# Patient Record
Sex: Male | Born: 1945 | Hispanic: Yes | Marital: Married
Health system: Southern US, Community
[De-identification: ages and names within clinical notes are randomized; demographics above are authoritative.]

---

## 2006-06-26 ENCOUNTER — Other Ambulatory Visit: Payer: Self-pay

## 2006-06-30 ENCOUNTER — Ambulatory Visit: Payer: Self-pay | Admitting: Surgery

## 2008-01-06 ENCOUNTER — Other Ambulatory Visit: Payer: Self-pay

## 2008-01-06 ENCOUNTER — Ambulatory Visit: Payer: Self-pay | Admitting: Surgery

## 2008-07-13 IMAGING — CR DG CHEST 2V
1 series · 2 of 2 positions shown · non-contrast
Comparison: none

REASON FOR EXAM: htn
COMMENTS:

PROCEDURE:     DXR - DXR CHEST PA (OR AP) AND LATERAL  - June 26, 2006  [DATE]
RESULT:     PA and lateral view reveals the cardiomediastinal structures to
be within normal limits.  The lung fields are clear. Vascularity is within
normal limits with no effusions seen.

[Series 1: view not recorded · 0.17mm/px · 2 of 2 slices shown]
[im 1/2]
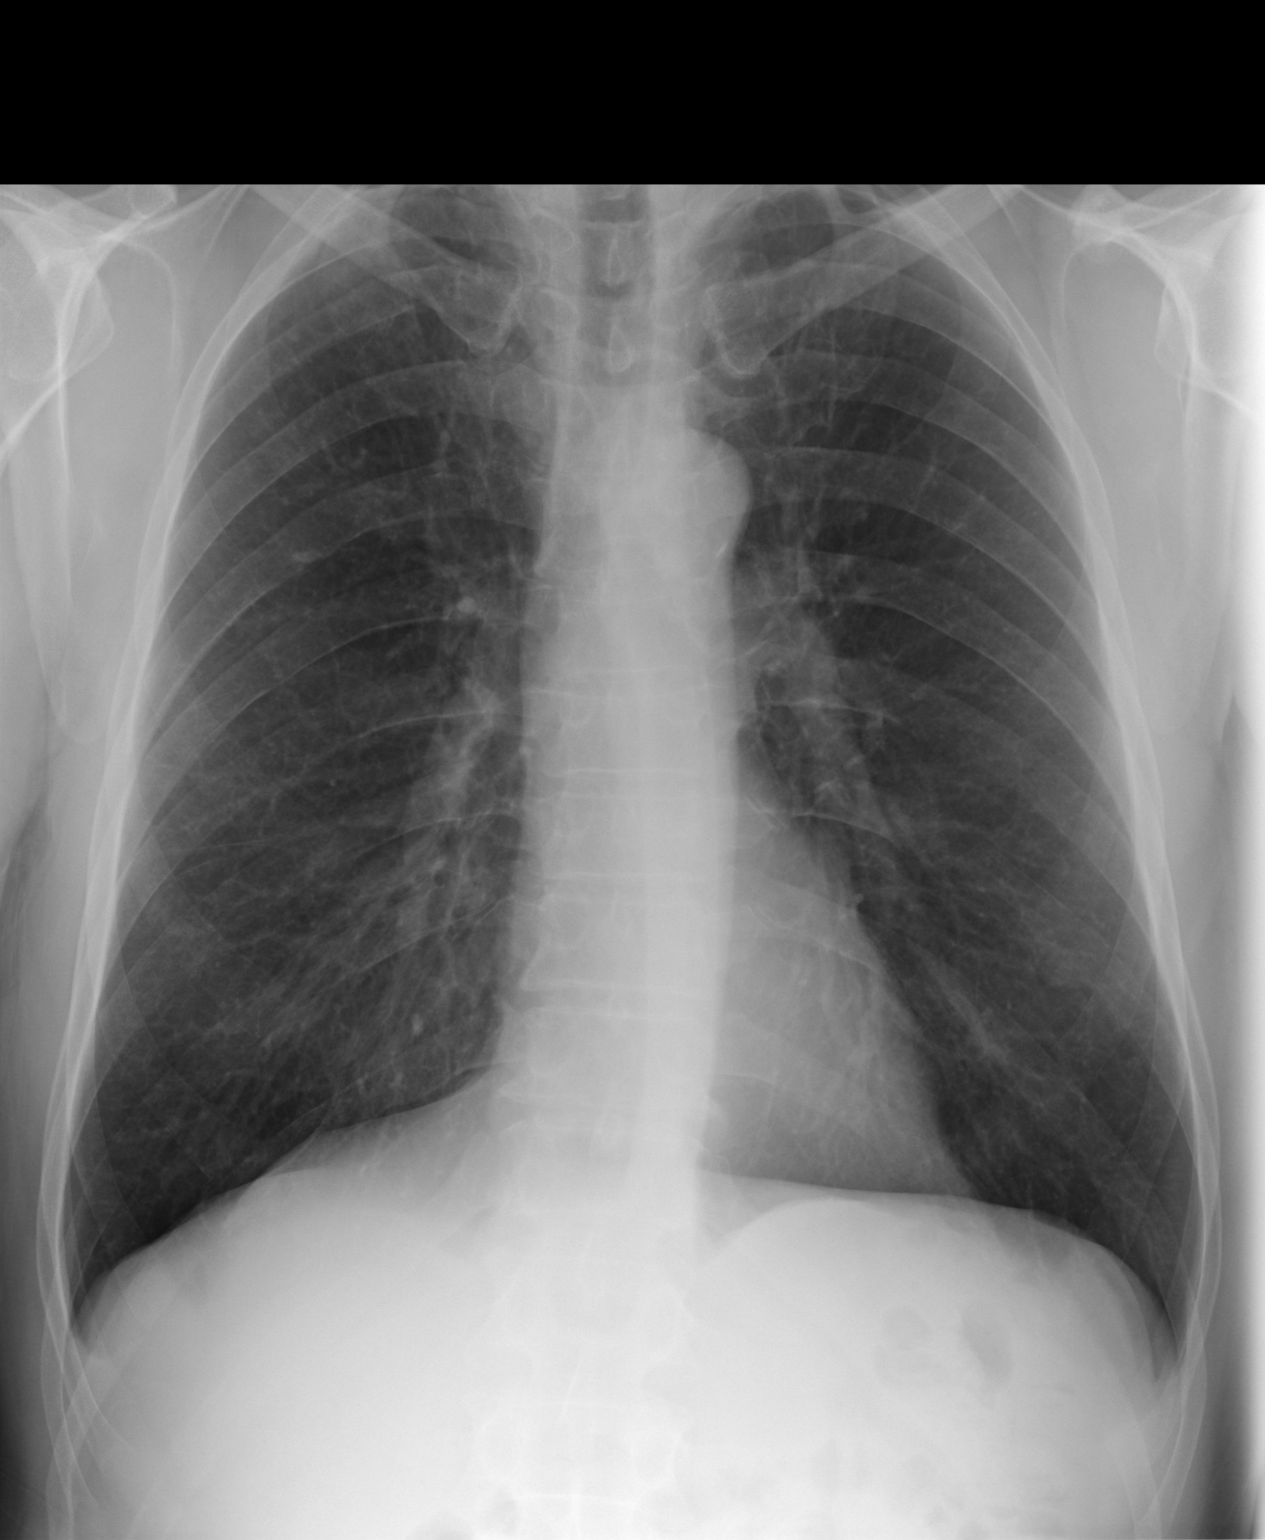
[im 2/2]
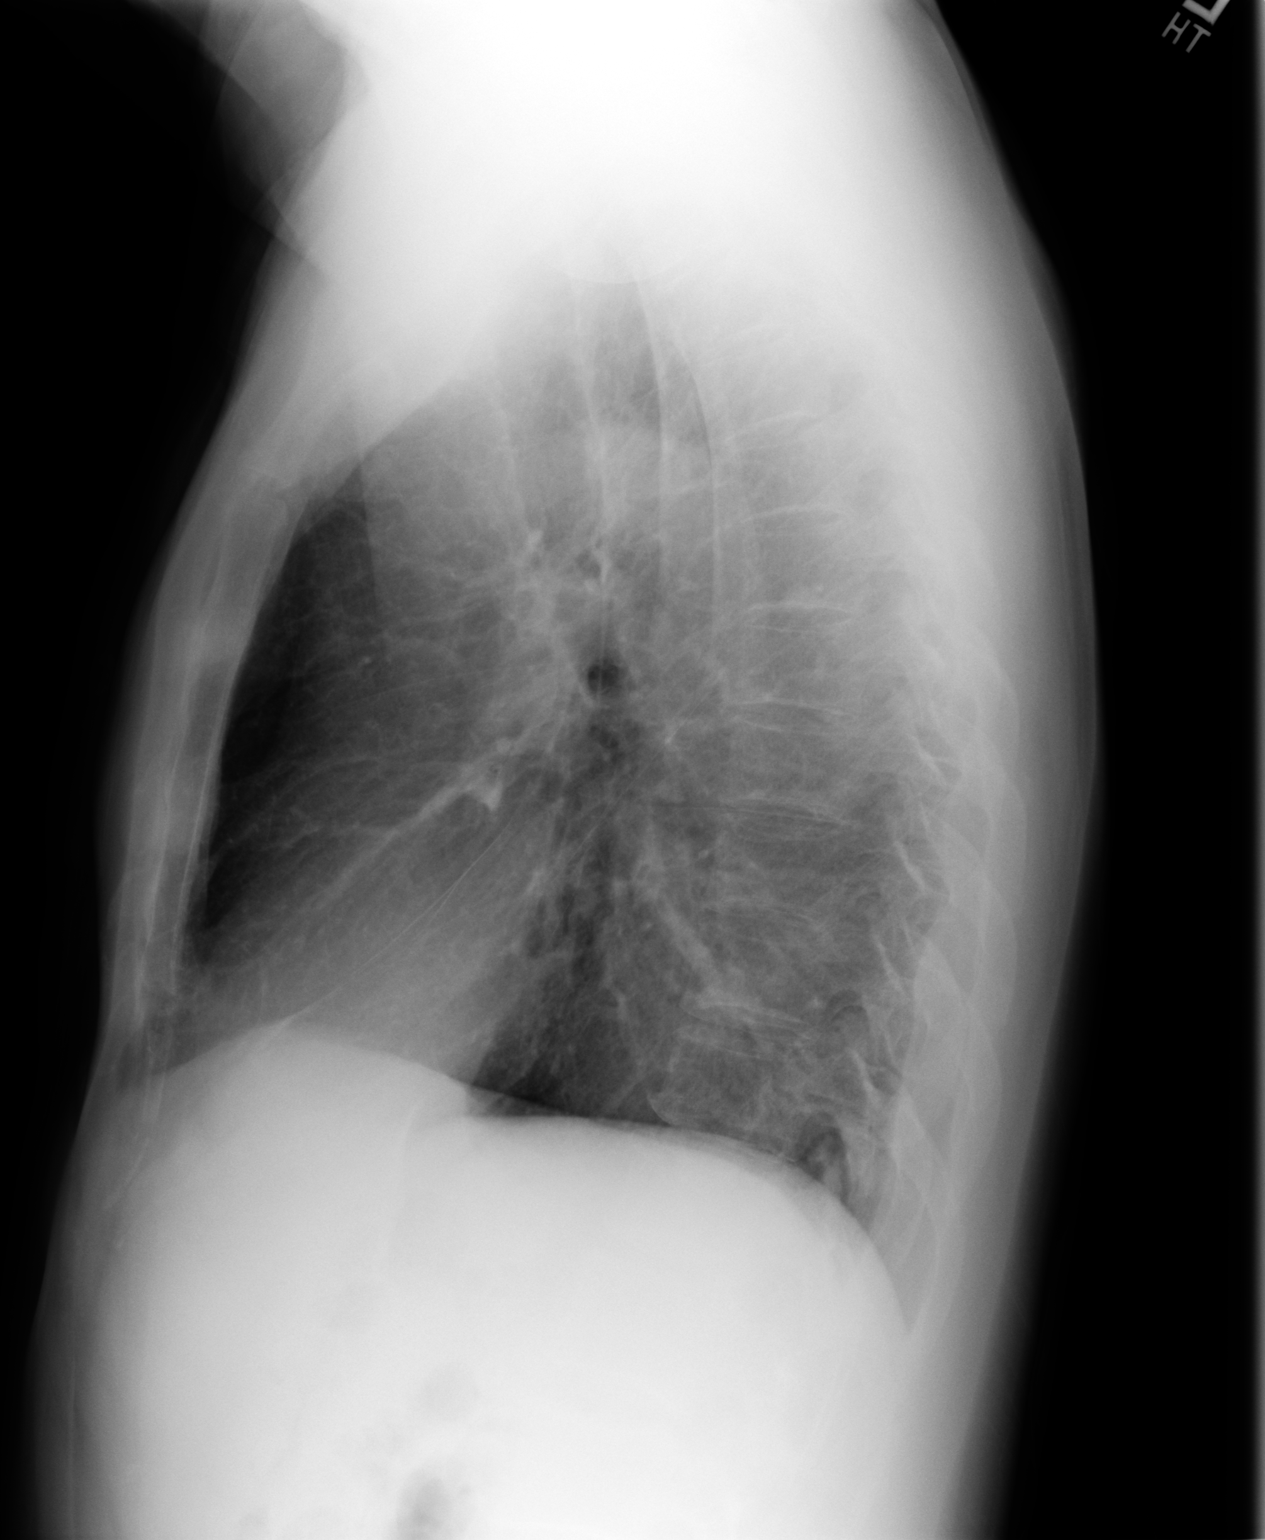

[2 of 2 positions shown; findings below may reference images not displayed]

IMPRESSION: 1)Lung fields are clear.

## 2013-01-08 ENCOUNTER — Ambulatory Visit: Payer: Self-pay | Admitting: Unknown Physician Specialty

## 2015-09-08 ENCOUNTER — Emergency Department
Admission: EM | Admit: 2015-09-08 | Discharge: 2015-09-08 | Disposition: A | Discharge status: Home / Self Care | Payer: Medicare Other | Attending: Emergency Medicine | Admitting: Emergency Medicine

## 2015-09-08 DIAGNOSIS — M6281 Muscle weakness (generalized): Secondary | ICD-10-CM | POA: Diagnosis present

## 2015-09-08 DIAGNOSIS — Z880 Allergy status to penicillin: Secondary | ICD-10-CM | POA: Insufficient documentation

## 2015-09-08 DIAGNOSIS — R531 Weakness: Secondary | ICD-10-CM

## 2015-09-08 DIAGNOSIS — R208 Other disturbances of skin sensation: Secondary | ICD-10-CM | POA: Diagnosis not present

## 2015-09-08 LAB — COMPREHENSIVE METABOLIC PANEL
ALBUMIN: 4.3 g/dL (ref 3.5–5.0)
ALK PHOS: 78 U/L (ref 38–126)
ALT: 42 U/L (ref 17–63)
ANION GAP: 5 (ref 5–15)
AST: 28 U/L (ref 15–41)
BILIRUBIN TOTAL: 1.3 mg/dL — AB (ref 0.3–1.2)
BUN: 20 mg/dL (ref 6–20)
CALCIUM: 8.6 mg/dL — AB (ref 8.9–10.3)
CO2: 23 mmol/L (ref 22–32)
CREATININE: 0.71 mg/dL (ref 0.61–1.24)
Chloride: 107 mmol/L (ref 101–111)
GFR calc Af Amer: >60 mL/min (ref >60)
GFR calc non Af Amer: >60 mL/min (ref >60)
GLUCOSE: 97 mg/dL (ref 65–99)
Potassium: 3.9 mmol/L (ref 3.5–5.1)
Sodium: 135 mmol/L (ref 135–145)
TOTAL PROTEIN: 7.5 g/dL (ref 6.5–8.1)

## 2015-09-08 LAB — CBC
HCT: 44.9 % (ref 40.0–52.0)
HEMOGLOBIN: 15.3 g/dL (ref 13.0–18.0)
MCH: 29.2 pg (ref 26.0–34.0)
MCHC: 34 g/dL (ref 32.0–36.0)
MCV: 85.8 fL (ref 80.0–100.0)
Platelets: 178 10*3/uL (ref 150–440)
RBC: 5.24 MIL/uL (ref 4.40–5.90)
RDW: 14.3 % (ref 11.5–14.5)
WBC: 9.4 10*3/uL (ref 3.8–10.6)

## 2015-09-08 LAB — TROPONIN I: Troponin I: <0.03 ng/mL (ref <0.031)

## 2015-09-08 NOTE — ED Notes (Signed)
Pt alert and oriented X4, active, cooperative, pt in NAD. RR even and unlabored, color WNL.  Pt informed to return if any life threatening symptoms occur.  Pt has appt with PCP today.

## 2015-09-08 NOTE — ED Provider Notes (Signed)
[facility] Emergency Department Provider Note  ____________________________________________  Time seen: Approximately 4:08 AM  I have reviewed the triage vital signs and the nursing notes.   HISTORY  Chief Complaint Extremity Weakness    HPI Justin Jennings is a 70 y.o. male patient reports about 3-4 times a week his feet get warm and burning feeling. Today he had an episode that lasted maybe 40 minutes of his chest and arms burning as well. They felt hot he did not get sweaty was not short of breath nothing else happened went away spontaneously. Patient reports some months ago he went walking for an hour and 15 minutes then his legs gave way and hefell and skinned his nose. Not had other episodes of weakness like this although when his chest and arms were burning he did feel weak in his legs. Patient did not again feel short of breath get sweaty or have any tightness or other discomfort just does this feeling like he was on fire. The same kind of feeling he had in his feet. Patient's troponin is negative patient wants to wait for tomorrow to get a chest x-ray in the doctor's office since his lungs are clear on exam I will go along with his wishes.   No past medical history on file.  There are no active problems to display for this patient.   No past surgical history on file.  No current outpatient prescriptions on file.  Allergies Penicillins  No family history on file.  Social History Social History  Substance Use Topics  . Smoking status: Not on file  . Smokeless tobacco: Not on file  . Alcohol Use: Not on file    Review of Systems Constitutional: No fever/chills Eyes: No visual changes. ENT: No sore throat. Cardiovascular: Denies chest pain. Respiratory: Denies shortness of breath. Gastrointestinal: No abdominal pain.  No nausea, no vomiting.  No diarrhea.  No constipation. Genitourinary: Negative for dysuria. Musculoskeletal: Negative  for back pain. Skin: Negative for rash. Neurological: Negative for headaches, focal weakness or numbness.  10-point ROS otherwise negative.  ____________________________________________   PHYSICAL EXAM:  VITAL SIGNS: ED Triage Vitals  Enc Vitals Group     BP 09/08/15 0140 158/88 mmHg     Pulse Rate 09/08/15 0140 78     Resp 09/08/15 0140 18     Temp 09/08/15 0140 97.7 F (36.5 C)     Temp src --      SpO2 09/08/15 0140 96 %     Weight 09/08/15 0140 185 lb (83.915 kg)     Height 09/08/15 0140 5\' 9"  (1.753 m)     Head Cir --      Peak Flow --      Pain Score 09/08/15 0343 0     Pain Loc --      Pain Edu? --      Excl. in GC? --     Constitutional: Alert and oriented. Well appearing and in no acute distress. Eyes: Conjunctivae are normal. PERRL. EOMI. Head: Atraumatic. Nose: No congestion/rhinnorhea. Mouth/Throat: Mucous membranes are moist.  Oropharynx non-erythematous. Neck: No stridor.  Cardiovascular: Normal rate, regular rhythm. Grossly normal heart sounds.  Good peripheral circulation. Respiratory: Normal respiratory effort.  No retractions. Lungs CTAB. Gastrointestinal: Soft and nontender. No distention. No abdominal bruits. No CVA tenderness. Musculoskeletal: No lower extremity tenderness nor edema.  No joint effusions. Neurologic:  Normal speech and language. No gross focal neurologic deficits are appreciated. No gait instability. Skin:  Skin  is warm, dry and intact. No rash noted. Psychiatric: Mood and affect are normal. Speech and behavior are normal.  ____________________________________________   LABS (all labs ordered are listed, but only abnormal results are displayed)  Labs Reviewed  COMPREHENSIVE METABOLIC PANEL - Abnormal; Notable for the following:    Calcium 8.6 (*)    Total Bilirubin 1.3 (*)    All other components within normal limits  CBC  TROPONIN I  TSH  FOLATE RBC  VITAMIN B12    ____________________________________________  EKG  EKG read and interpreted by me shows normal sinus rhythm rate of 79 left axis no acute ST-T wave changes ____________________________________________  RADIOLOGY   ____________________________________________   PROCEDURES    ____________________________________________   INITIAL IMPRESSION / ASSESSMENT AND PLAN / ED COURSE  Pertinent labs & imaging results that were available during my care of the patient were reviewed by me and considered in my medical decision making (see chart for details).   ____________________________________________   FINAL CLINICAL IMPRESSION(S) / ED DIAGNOSES  Final diagnoses:  Weakness    Real diagnosis is intermittent burning sensation and feet and occasionally and chest  Arnaldo Natal, MD 09/08/15 [phone]

## 2015-09-08 NOTE — ED Notes (Addendum)
Pt reports intermittent hot feeling to bilateral lower legs,feet along with equal weakness to legs/feet for "a few weeks". Pt did not "feel well" earlier today per family. Pt denies weakness, hot feeling at this time along with denying CP, SOB. Pt alert and oriented X4, active, cooperative, pt in NAD. RR even and unlabored, color WNL.

## 2015-09-08 NOTE — ED Notes (Signed)
Pt in with co bilat leg weakness and feel states "I feel like im burning everywhere".  States no chest pain but does have burning to chest and back.

## 2015-09-08 NOTE — Discharge Instructions (Signed)
Weakness Weakness is a lack of strength. It may be felt all over the body (generalized) or in one specific part of the body (focal). Some causes of weakness can be serious. You may need further medical evaluation, especially if you are elderly or you have a history of immunosuppression (such as chemotherapy or HIV), kidney disease, heart disease, or diabetes. CAUSES  Weakness can be caused by many different things, including:  Infection.  Physical exhaustion.  Internal bleeding or other blood loss that results in a lack of red blood cells (anemia).  Dehydration. This cause is more common in elderly people.  Side effects or electrolyte abnormalities from medicines, such as pain medicines or sedatives.  Emotional distress, anxiety, or depression.  Circulation problems, especially severe peripheral arterial disease.  Heart disease, such as rapid atrial fibrillation, bradycardia, or heart failure.  Nervous system disorders, such as Guillain-Barr syndrome, multiple sclerosis, or stroke. DIAGNOSIS  To find the cause of your weakness, your caregiver will take your history and perform a physical exam. Lab tests or X-rays may also be ordered, if needed. TREATMENT  Treatment of weakness depends on the cause of your symptoms and can vary greatly. HOME CARE INSTRUCTIONS   Rest as needed.  Eat a well-balanced diet.  Try to get some exercise every day.  Only take over-the-counter or prescription medicines as directed by your caregiver. SEEK MEDICAL CARE IF:   Your weakness seems to be getting worse or spreads to other parts of your body.  You develop new aches or pains. SEEK IMMEDIATE MEDICAL CARE IF:   You cannot perform your normal daily activities, such as getting dressed and feeding yourself.  You cannot walk up and down stairs, or you feel exhausted when you do so.  You have shortness of breath or chest pain.  You have difficulty moving parts of your body.  You have weakness  in only one area of the body or on only one side of the body.  You have a fever.  You have trouble speaking or swallowing.  You cannot control your bladder or bowel movements.  You have black or bloody vomit or stools. MAKE SURE YOU:  Understand these instructions.  Will watch your condition.  Will get help right away if you are not doing well or get worse.   This information is not intended to replace advice given to you by your health care provider. Make sure you discuss any questions you have with your health care provider.   Document Released: 06/10/2005 Document Revised: 12/10/2011 Document Reviewed: 08/09/2011 [employer] Interactive Patient Education [employer]   Please follow-up with your family doctor as planned. Please return if you're worse. It might be useful to follow with the cardiologist as well.

## 2022-07-08 DIAGNOSIS — R7302 Impaired glucose tolerance (oral): Secondary | ICD-10-CM | POA: Diagnosis not present

## 2022-07-08 DIAGNOSIS — E7800 Pure hypercholesterolemia, unspecified: Secondary | ICD-10-CM | POA: Diagnosis not present

## 2022-07-08 DIAGNOSIS — K219 Gastro-esophageal reflux disease without esophagitis: Secondary | ICD-10-CM | POA: Diagnosis not present

## 2022-07-08 DIAGNOSIS — Z79899 Other long term (current) drug therapy: Secondary | ICD-10-CM | POA: Diagnosis not present

## 2023-01-08 DIAGNOSIS — K219 Gastro-esophageal reflux disease without esophagitis: Secondary | ICD-10-CM | POA: Diagnosis not present

## 2023-01-08 DIAGNOSIS — Z79899 Other long term (current) drug therapy: Secondary | ICD-10-CM | POA: Diagnosis not present

## 2023-01-08 DIAGNOSIS — R7302 Impaired glucose tolerance (oral): Secondary | ICD-10-CM | POA: Diagnosis not present

## 2023-01-08 DIAGNOSIS — Z1331 Encounter for screening for depression: Secondary | ICD-10-CM | POA: Diagnosis not present

## 2023-01-08 DIAGNOSIS — E7800 Pure hypercholesterolemia, unspecified: Secondary | ICD-10-CM | POA: Diagnosis not present

## 2023-01-08 DIAGNOSIS — Z0000 Encounter for general adult medical examination without abnormal findings: Secondary | ICD-10-CM | POA: Diagnosis not present

## 2023-04-02 DIAGNOSIS — Z23 Encounter for immunization: Secondary | ICD-10-CM | POA: Diagnosis not present

## 2023-07-11 DIAGNOSIS — E7800 Pure hypercholesterolemia, unspecified: Secondary | ICD-10-CM | POA: Diagnosis not present

## 2023-07-11 DIAGNOSIS — Z79899 Other long term (current) drug therapy: Secondary | ICD-10-CM | POA: Diagnosis not present

## 2023-07-11 DIAGNOSIS — R7302 Impaired glucose tolerance (oral): Secondary | ICD-10-CM | POA: Diagnosis not present

## 2023-07-11 DIAGNOSIS — K219 Gastro-esophageal reflux disease without esophagitis: Secondary | ICD-10-CM | POA: Diagnosis not present

## 2024-01-13 DIAGNOSIS — E7800 Pure hypercholesterolemia, unspecified: Secondary | ICD-10-CM | POA: Diagnosis not present

## 2024-01-13 DIAGNOSIS — K219 Gastro-esophageal reflux disease without esophagitis: Secondary | ICD-10-CM | POA: Diagnosis not present

## 2024-01-13 DIAGNOSIS — R7302 Impaired glucose tolerance (oral): Secondary | ICD-10-CM | POA: Diagnosis not present

## 2024-01-13 DIAGNOSIS — Z79899 Other long term (current) drug therapy: Secondary | ICD-10-CM | POA: Diagnosis not present

## 2024-01-13 DIAGNOSIS — Z0000 Encounter for general adult medical examination without abnormal findings: Secondary | ICD-10-CM | POA: Diagnosis not present

## 2024-01-13 DIAGNOSIS — Z1331 Encounter for screening for depression: Secondary | ICD-10-CM | POA: Diagnosis not present

## 2024-03-29 DIAGNOSIS — Z23 Encounter for immunization: Secondary | ICD-10-CM | POA: Diagnosis not present

## 2024-05-11 DIAGNOSIS — H2512 Age-related nuclear cataract, left eye: Secondary | ICD-10-CM | POA: Diagnosis not present
